# Patient Record
Sex: Female | Born: 1965 | Race: White | Hispanic: No | Marital: Married | Smoking: Never smoker
Health system: Southern US, Community
[De-identification: ages and names within clinical notes are randomized; demographics above are authoritative.]

## PROBLEM LIST (undated history)

## (undated) DIAGNOSIS — F329 Major depressive disorder, single episode, unspecified: Secondary | ICD-10-CM

## (undated) DIAGNOSIS — K219 Gastro-esophageal reflux disease without esophagitis: Secondary | ICD-10-CM

## (undated) DIAGNOSIS — F419 Anxiety disorder, unspecified: Secondary | ICD-10-CM

## (undated) DIAGNOSIS — F32A Depression, unspecified: Secondary | ICD-10-CM

## (undated) HISTORY — PX: TUBAL LIGATION: SHX77

## (undated) HISTORY — PX: CHOLECYSTECTOMY: SHX55

---

## 2008-11-27 ENCOUNTER — Ambulatory Visit (HOSPITAL_BASED_OUTPATIENT_CLINIC_OR_DEPARTMENT_OTHER): Admission: RE | Admit: 2008-11-27 | Discharge: 2008-11-27 | Payer: Self-pay | Admitting: Internal Medicine

## 2008-11-27 ENCOUNTER — Ambulatory Visit: Payer: Self-pay | Admitting: Diagnostic Radiology

## 2014-05-09 ENCOUNTER — Encounter (HOSPITAL_BASED_OUTPATIENT_CLINIC_OR_DEPARTMENT_OTHER): Payer: Self-pay | Admitting: Emergency Medicine

## 2014-05-09 ENCOUNTER — Emergency Department (HOSPITAL_BASED_OUTPATIENT_CLINIC_OR_DEPARTMENT_OTHER)
Admission: EM | Admit: 2014-05-09 | Discharge: 2014-05-09 | Disposition: A | Discharge status: Home / Self Care | Payer: Commercial Managed Care - PPO | Attending: Emergency Medicine | Admitting: Emergency Medicine

## 2014-05-09 DIAGNOSIS — F329 Major depressive disorder, single episode, unspecified: Secondary | ICD-10-CM | POA: Insufficient documentation

## 2014-05-09 DIAGNOSIS — Y9389 Activity, other specified: Secondary | ICD-10-CM | POA: Diagnosis not present

## 2014-05-09 DIAGNOSIS — K219 Gastro-esophageal reflux disease without esophagitis: Secondary | ICD-10-CM | POA: Diagnosis not present

## 2014-05-09 DIAGNOSIS — F419 Anxiety disorder, unspecified: Secondary | ICD-10-CM | POA: Insufficient documentation

## 2014-05-09 DIAGNOSIS — W260XXA Contact with knife, initial encounter: Secondary | ICD-10-CM | POA: Insufficient documentation

## 2014-05-09 DIAGNOSIS — Z79899 Other long term (current) drug therapy: Secondary | ICD-10-CM | POA: Diagnosis not present

## 2014-05-09 DIAGNOSIS — S61211A Laceration without foreign body of left index finger without damage to nail, initial encounter: Secondary | ICD-10-CM | POA: Diagnosis present

## 2014-05-09 DIAGNOSIS — S61311A Laceration without foreign body of left index finger with damage to nail, initial encounter: Secondary | ICD-10-CM | POA: Diagnosis not present

## 2014-05-09 DIAGNOSIS — Y9289 Other specified places as the place of occurrence of the external cause: Secondary | ICD-10-CM | POA: Diagnosis not present

## 2014-05-09 HISTORY — DX: Anxiety disorder, unspecified: F41.9

## 2014-05-09 HISTORY — DX: Major depressive disorder, single episode, unspecified: F32.9

## 2014-05-09 HISTORY — DX: Depression, unspecified: F32.A

## 2014-05-09 HISTORY — DX: Gastro-esophageal reflux disease without esophagitis: K21.9

## 2014-05-09 MED ORDER — LIDOCAINE HCL (PF) 1 % IJ SOLN
5.0000 mL | Freq: Once | INTRAMUSCULAR | Status: DC
  Filled 2014-05-09: qty 5

## 2014-05-09 NOTE — ED Notes (Signed)
I removed patient's bandage, wound is actively bleeding, so I did not start a soak until Dr. / PA. Approve.

## 2014-05-09 NOTE — Discharge Instructions (Signed)
Keep wound clean. Refer to attached documents for more information. Return to the ED in 10 days for suture removal.  °

## 2014-05-09 NOTE — ED Provider Notes (Signed)
CSN: [id number]     Arrival date & time 05/09/14  2157 History   First MD Initiated Contact with Patient 05/09/14 2208     Chief Complaint  Patient presents with  . Extremity Laceration     (Consider location/radiation/quality/duration/timing/severity/associated sxs/prior Treatment) HPI Comments: Patient is a 48 year old female who presents with a laceration of her left index finger that occurred prior to arrival. Patient reports cutting a lime in her hand when the knife slipped, and she cut her left index finger. No other injuries. Patient reports moderate pain at the left index finger without radiation with associated bleeding. No other injuries. Patient denies any other symptoms.    Past Medical History  Diagnosis Date  . Anxiety   . Depression   . GERD (gastroesophageal reflux disease)    Past Surgical History  Procedure Laterality Date  . Cholecystectomy    . Tubal ligation     History reviewed. No pertinent family history. History  Substance Use Topics  . Smoking status: Never Smoker   . Smokeless tobacco: Not on file  . Alcohol Use: Yes     Comment: 2 - 3 times a week   OB History   Grav Para Term Preterm Abortions TAB SAB Ect Mult Living                 Review of Systems  Constitutional: Negative for fever, chills and fatigue.  HENT: Negative for trouble swallowing.   Eyes: Negative for visual disturbance.  Respiratory: Negative for shortness of breath.   Cardiovascular: Negative for chest pain and palpitations.  Gastrointestinal: Negative for nausea, vomiting, abdominal pain and diarrhea.  Genitourinary: Negative for dysuria and difficulty urinating.  Musculoskeletal: Negative for arthralgias and neck pain.  Skin: Positive for wound. Negative for color change.  Neurological: Negative for dizziness and weakness.  Psychiatric/Behavioral: Negative for dysphoric mood.      Allergies  Review of patient's allergies indicates no known allergies.  Home  Medications   Prior to Admission medications   Medication Sig Start Date End Date Taking? Authorizing Provider  ALPRAZolam (XANAX PO) Take by mouth.   Yes Historical Provider, MD  Amphetamine-Dextroamphetamine (ADDERALL PO) Take by mouth.   Yes Historical Provider, MD  BuPROPion HCl (WELLBUTRIN PO) Take by mouth.   Yes Historical Provider, MD  Pantoprazole Sodium (PROTONIX PO) Take by mouth.   Yes Historical Provider, MD   BP 140/109  Pulse 74  Temp(Src) 97.5 F (36.4 C) (Oral)  Resp 18  Ht 5\' 6"  (1.676 m)  Wt 141 lb (63.957 kg)  BMI 22.77 kg/m2  SpO2 99% Physical Exam  Nursing note and vitals reviewed. Constitutional: She appears well-developed and well-nourished. No distress.  HENT:  Head: Normocephalic and atraumatic.  Eyes: Conjunctivae are normal.  Neck: Normal range of motion.  Cardiovascular: Normal rate and regular rhythm.  Exam reveals no gallop and no friction rub.   No murmur heard. Pulmonary/Chest: Effort normal and breath sounds normal. She has no wheezes. She has no rales. She exhibits no tenderness.  Abdominal: Soft. There is no tenderness.  Musculoskeletal: Normal range of motion.  Full ROM of left index finger. Flexor and extensor tendons intact.   Neurological: She is alert.  Speech is goal-oriented. Moves limbs without ataxia.   Skin: Skin is warm and dry.  Deep 2.5 cm laceration of volar aspect of base of left index finger. Bleeding controlled.     ED Course  Procedures (including critical care time) Labs Review Labs Reviewed -  No data to display  LACERATION REPAIR Performed by: Emilia BeckKaitlyn Brinden Kincheloe Authorized by: Emilia BeckKaitlyn Rojelio Uhrich Consent: Verbal consent obtained. Risks and benefits: risks, benefits and alternatives were discussed Consent given by: patient Patient identity confirmed: provided demographic data Prepped and Draped in normal sterile fashion Wound explored  Laceration Location: volar aspect of base of left index finger  Laceration  Length: 2.5 cm  No Foreign Bodies seen or palpated  Anesthesia: local infiltration  Local anesthetic: lidocaine 1% without epinephrine  Anesthetic total: 2 ml  Irrigation method: syringe Amount of cleaning: standard  Skin closure: 5-0 prolene  Number of sutures: 7  Technique: simple  Patient tolerance: Patient tolerated the procedure well with no immediate complications.   Imaging Review No results found.   EKG Interpretation None      MDM   Final diagnoses:  Laceration of left index finger w/o foreign body with damage to nail, initial encounter    11:22 PM Laceration repaired without difficulty. Patient instructed to keep wound clean and covered. Patient will return in 10 days for suture removal. No neurovascular compromise or tendon injury.   Emilia BeckKaitlyn Asiya Cutbirth, PA-C 05/09/14 2329  Rolland PorterMark James, MD 05/15/14 [id number]

## 2014-05-09 NOTE — ED Notes (Signed)
Pt reports laceration to left hand index finger, after a cutting a lime - bleeding controlled at this time - pt reports she thinks she passed out after the laceration.

## 2016-09-09 ENCOUNTER — Encounter (HOSPITAL_BASED_OUTPATIENT_CLINIC_OR_DEPARTMENT_OTHER): Payer: Self-pay | Admitting: Adult Health

## 2016-09-09 ENCOUNTER — Emergency Department (HOSPITAL_BASED_OUTPATIENT_CLINIC_OR_DEPARTMENT_OTHER)
Admission: EM | Admit: 2016-09-09 | Discharge: 2016-09-09 | Disposition: A | Discharge status: Home / Self Care | Payer: Commercial Managed Care - PPO | Attending: Emergency Medicine | Admitting: Emergency Medicine

## 2016-09-09 ENCOUNTER — Emergency Department (HOSPITAL_BASED_OUTPATIENT_CLINIC_OR_DEPARTMENT_OTHER): Payer: Commercial Managed Care - PPO

## 2016-09-09 DIAGNOSIS — S01112A Laceration without foreign body of left eyelid and periocular area, initial encounter: Secondary | ICD-10-CM | POA: Diagnosis not present

## 2016-09-09 DIAGNOSIS — Y999 Unspecified external cause status: Secondary | ICD-10-CM | POA: Insufficient documentation

## 2016-09-09 DIAGNOSIS — W19XXXA Unspecified fall, initial encounter: Secondary | ICD-10-CM

## 2016-09-09 DIAGNOSIS — Z79899 Other long term (current) drug therapy: Secondary | ICD-10-CM | POA: Insufficient documentation

## 2016-09-09 DIAGNOSIS — S0181XA Laceration without foreign body of other part of head, initial encounter: Secondary | ICD-10-CM

## 2016-09-09 DIAGNOSIS — Y9389 Activity, other specified: Secondary | ICD-10-CM | POA: Diagnosis not present

## 2016-09-09 DIAGNOSIS — W1839XA Other fall on same level, initial encounter: Secondary | ICD-10-CM | POA: Diagnosis not present

## 2016-09-09 DIAGNOSIS — M25532 Pain in left wrist: Secondary | ICD-10-CM

## 2016-09-09 DIAGNOSIS — Y929 Unspecified place or not applicable: Secondary | ICD-10-CM | POA: Diagnosis not present

## 2016-09-09 MED ORDER — LIDOCAINE-EPINEPHRINE-TETRACAINE (LET) SOLUTION
3.0000 mL | Freq: Once | NASAL | Status: AC
  Administered 2016-09-09: 3 mL via TOPICAL
  Filled 2016-09-09: qty 3

## 2016-09-09 MED ORDER — LIDOCAINE HCL (PF) 2 % IJ SOLN
INTRAMUSCULAR | Status: AC
  Administered 2016-09-09: 5 mL
  Filled 2016-09-09: qty 2

## 2016-09-09 MED ORDER — LIDOCAINE-EPINEPHRINE (PF) 2 %-1:200000 IJ SOLN: 10.0000 mL | Freq: Once | INTRAMUSCULAR | Status: DC

## 2016-09-09 MED ORDER — TETANUS-DIPHTH-ACELL PERTUSSIS 5-2.5-18.5 LF-MCG/0.5 IM SUSP
0.5000 mL | Freq: Once | INTRAMUSCULAR | Status: AC
  Administered 2016-09-09: 0.5 mL via INTRAMUSCULAR
  Filled 2016-09-09: qty 0.5

## 2016-09-09 MED ORDER — OXYCODONE-ACETAMINOPHEN 5-325 MG PO TABS
1.0000 | ORAL_TABLET | ORAL | Status: DC | PRN
  Administered 2016-09-09: 1 via ORAL
  Filled 2016-09-09: qty 1

## 2016-09-09 NOTE — ED Provider Notes (Signed)
[facility]   CSN: [id number] Arrival date & time: 09/09/16  1539     History   Chief Complaint Chief Complaint  Patient presents with  . Fall    HPI Carolyn Pearson is a 51 y.o. female presenting post mechanical fall today. She states she fell while pulling a heavy wheel barrow and it started to turn over and lose balance, the wheel barrel took her down with it and landed on left wrist, left cheek and forehead onto concrete. She states her left cheek hurts the most and felt like the pain was worsening, constant and 8/10. She states her left cheek pain is worse than her left hand pain. Denies LOC. Denies neck pain. She denies any vision changed, chest pain, shortness of breath, fever, chills, nausea, vomiting, diarrhea. She states she has a history of carpel tunnel to wrist, but no other previous history. She reports 2 small laceration to left eyelid, left cheek swelling and bruising. She reports being able to open mouth fully. She reports coming straight to the ED and did not try anything. She states she was given a percocet with relief. She denies anticoagulation use or any type of blood thinner use. Does not know tenderness status.  The history is provided by the patient. No language interpreter was used.    Past Medical History:  Diagnosis Date  . Anxiety   . Depression   . GERD (gastroesophageal reflux disease)     There are no active problems to display for this patient.   Past Surgical History:  Procedure Laterality Date  . CHOLECYSTECTOMY    . TUBAL LIGATION      OB History    No data available       Home Medications    Prior to Admission medications   Medication Sig Start Date End Date Taking? Authorizing Provider  ALPRAZolam (XANAX PO) Take by mouth.    Historical Provider, MD  Amphetamine-Dextroamphetamine (ADDERALL PO) Take by mouth.    Historical Provider, MD  BuPROPion HCl (WELLBUTRIN PO) Take by mouth.    Historical Provider,  MD  Pantoprazole Sodium (PROTONIX PO) Take by mouth.    Historical Provider, MD    Family History History reviewed. No pertinent family history.  Social History Social History  Substance Use Topics  . Smoking status: Never Smoker  . Smokeless tobacco: Not on file  . Alcohol use Yes     Comment: 2 - 3 times a week     Allergies   Patient has no known allergies.   Review of Systems Review of Systems  Constitutional: Negative for chills and fever.  Eyes: Negative for photophobia and visual disturbance.  Respiratory: Negative for shortness of breath.   Cardiovascular: Negative for chest pain.  Skin: Positive for wound.     Physical Exam Updated Vital Signs BP 127/87 (BP Location: Right Arm)   Pulse 75   Temp 97.9 F (36.6 C) (Oral)   Resp 18   Ht 5\' 6"  (1.676 m)   Wt 68 kg   LMP 08/25/2016 (Exact Date)   SpO2 100%   BMI 24.21 kg/m   Physical Exam  Constitutional: She is oriented to person, place, and time. She appears well-developed and well-nourished.  Well appearing  HENT:  Head: Normocephalic and atraumatic.  Nose: Nose normal.  Mouth/Throat: Oropharynx is clear and moist.  Dentition intact. 2 lacerations visualized of left. 1 is about 0.5cm. 2nd laceration is about 1 cm.   Eyes: Conjunctivae and EOM  are normal.  Neck: Normal range of motion.  Cardiovascular: Normal rate, normal heart sounds and intact distal pulses.   Pulmonary/Chest: Effort normal and breath sounds normal. No respiratory distress. She has no wheezes. She has no rales.  Normal work of breathing. No respiratory distress noted.   Abdominal: Soft. There is no tenderness. There is no rebound and no guarding.  Musculoskeletal: Normal range of motion.  Full ROM of left wrist and fingers. No redness, wound, swelling, deformity noted.  No midline cervical, thoracic, or lumbar spine tenderness. No paraspinal tenderness.   Neurological: She is alert and oriented to person, place, and time.    Sensation intact to distal to affected wrist. Muscle strength 5/5 to left wrist and fingers against resistance to flexion and extension.   Cranial Nerves:  III,IV, VI: ptosis not present, extra-ocular movements intact bilaterally, direct and consensual pupillary light reflexes intact bilaterally V: facial sensation, jaw opening, and bite strength equal bilaterally VII: eyebrow raise, eyelid close, smile, frown, pucker equal bilaterally VIII: hearing grossly normal bilaterally  IX,X: palate elevation and swallowing intact XI: bilateral shoulder shrug and lateral head rotation equal and strong XII: midline tongue extension  Negative pronator drift, negative Romberg, negative RAM's, negative heel-to-shin, negative finger to nose.    Sensory intact.  Muscle strength 5/5 Patient able to ambulate without difficulty.   Skin: Skin is warm.  Psychiatric: She has a normal mood and affect. Her behavior is normal.  Nursing note and vitals reviewed.    ED Treatments / Results  Labs (all labs ordered are listed, but only abnormal results are displayed) Labs Reviewed - No data to display  EKG  EKG Interpretation None       Radiology Dg Wrist Complete Left  Result Date: 09/09/2016 CLINICAL DATA:  Fall under left wrist earlier today. Lateral pain extending to the thumb. Initial encounter. EXAM: LEFT WRIST - COMPLETE 3+ VIEW COMPARISON:  None. FINDINGS: There is no evidence of fracture or dislocation. There is no evidence of arthropathy or other focal bone abnormality. Soft tissues are unremarkable. IMPRESSION: Negative. Electronically Signed   By: Marnee Spring M.D.   On: 09/09/2016 17:09    Procedures .Marland KitchenLaceration Repair Date/Time: 09/09/2016 7:04 PM Performed by: Alvina Chou Authorized by: Alvina Chou   Consent:    Consent obtained:  Verbal   Consent given by:  Patient   Risks discussed:  Infection, need for additional repair, poor cosmetic result, pain and  poor wound healing   Alternatives discussed:  No treatment and delayed treatment Anesthesia (see MAR for exact dosages):    Anesthesia method:  Local infiltration and topical application   Topical anesthetic:  LET   Local anesthetic:  Lidocaine 2% w/o epi Laceration details:    Location:  Face   Face location:  L upper eyelid   Extent:  Superficial   Wound length (cm): 2 lacerations. 1 is 0.5 cm. 2nd 1cm.   Depth (mm):  1 Repair type:    Repair type:  Simple Pre-procedure details:    Preparation:  Patient was prepped and draped in usual sterile fashion Exploration:    Hemostasis achieved with:  LET and direct pressure   Wound exploration: wound explored through full range of motion and entire depth of wound probed and visualized     Wound extent: no areolar tissue violation noted, no fascia violation noted, no foreign bodies/material noted, no muscle damage noted, no nerve damage noted, no tendon damage noted, no underlying fracture noted and  no vascular damage noted     Contaminated: no   Treatment:    Area cleansed with:  Betadine   Amount of cleaning:  Standard   Irrigation solution:  Sterile saline   Irrigation volume:  30 cc   Irrigation method:  Pressure wash and syringe   Visualized foreign bodies/material removed: no   Skin repair:    Repair method:  Sutures   Suture size:  6-0   Suture material:  Prolene   Suture technique:  Simple interrupted   Number of sutures:  7 Approximation:    Approximation:  Close   Vermilion border: well-aligned   Post-procedure details:    Dressing:  Sterile dressing   Patient tolerance of procedure:  Tolerated well, no immediate complications   (including critical care time)  Medications Ordered in ED Medications  lidocaine-EPINEPHrine-tetracaine (LET) solution (3 mLs Topical Given 09/09/16 1611)  lidocaine (XYLOCAINE) 2 % injection (5 mLs  Given 09/09/16 1919)  Tdap (BOOSTRIX) injection 0.5 mL (0.5 mLs Intramuscular Given 09/09/16 1823)       Initial Impression / Assessment and Plan / ED Course  I have reviewed the triage vital signs and the nursing notes.  Pertinent labs & imaging results that were available during my care of the patient were reviewed by me and considered in my medical decision making (see chart for details).    Patient presents today status post mechanical fall. Patient X-Ray negative for obvious fracture or dislocation of left wrist. Patient afebrile, hemodynamically stable, in no apparent distress. Pain managed in ED.  Patient had 2 lacerations over her left periorbital area. 7 stitches placed without any complications. The area did not look red or infectious. Patient was not given any antibiotics to go home with her as I did not feel it was necessary at this time. Tdap injection given here in ED. Pt advised to follow up with PCP regarding today's visit.  Conservative therapy recommended and discussed. Patient given instructions to return after 3-5 days for wound recheck and suture removal. Reasons to immediately return to the emergency department discussed.    Final Clinical Impressions(s) / ED Diagnoses   Final diagnoses:  Fall, initial encounter  Facial laceration, initial encounter  Left wrist pain    New Prescriptions Discharge Medication List as of 09/09/2016  7:10 PM       [address] [place], [place] 09/10/16 0101    Gwyneth Sprout, MD 09/10/16 1719

## 2016-09-09 NOTE — Discharge Instructions (Signed)
Please go to your primary care provider, urgent care or come back to this ED for wound recheck and suture removal in 3-5 days. Keep area clean and dry. Do not soak with water, pat dry. Use sunscreen to the area for the first year.   Get help right away if: You develop severe swelling around the injury site. Your pain suddenly increases and is severe. You develop painful lumps near the wound or on skin that is anywhere on your body. You have a red streak going away from your wound. The wound is on your hand or foot and you cannot properly move a finger or toe. The wound is on your hand or foot and you notice that your fingers or toes look pale or bluish.

## 2016-09-09 NOTE — ED Triage Notes (Signed)
Presents post fall, while pulling a heavy wheel barrow and it started to turn over and lose balance, the wheel barrel took her down with it and pt landed on left wrist, left cheek and forehead. Denies LOC. Denies neck pain  2 small laceration to left eyelid, left cheek swelling and bruising. Able to open mouth fully, EOMI intact. Teeth intact.

## 2016-09-09 NOTE — ED Notes (Signed)
PT discharged to home with patient. NAD.

## 2017-12-06 IMAGING — CR DG WRIST COMPLETE 3+V*L*
4 series · 4 of 4 positions shown · non-contrast
Comparison: None.

CLINICAL DATA: Fall under left wrist earlier today. Lateral pain
extending to the thumb. Initial encounter.

EXAM:
LEFT WRIST - COMPLETE 3+ VIEW

[x wrist pa left]
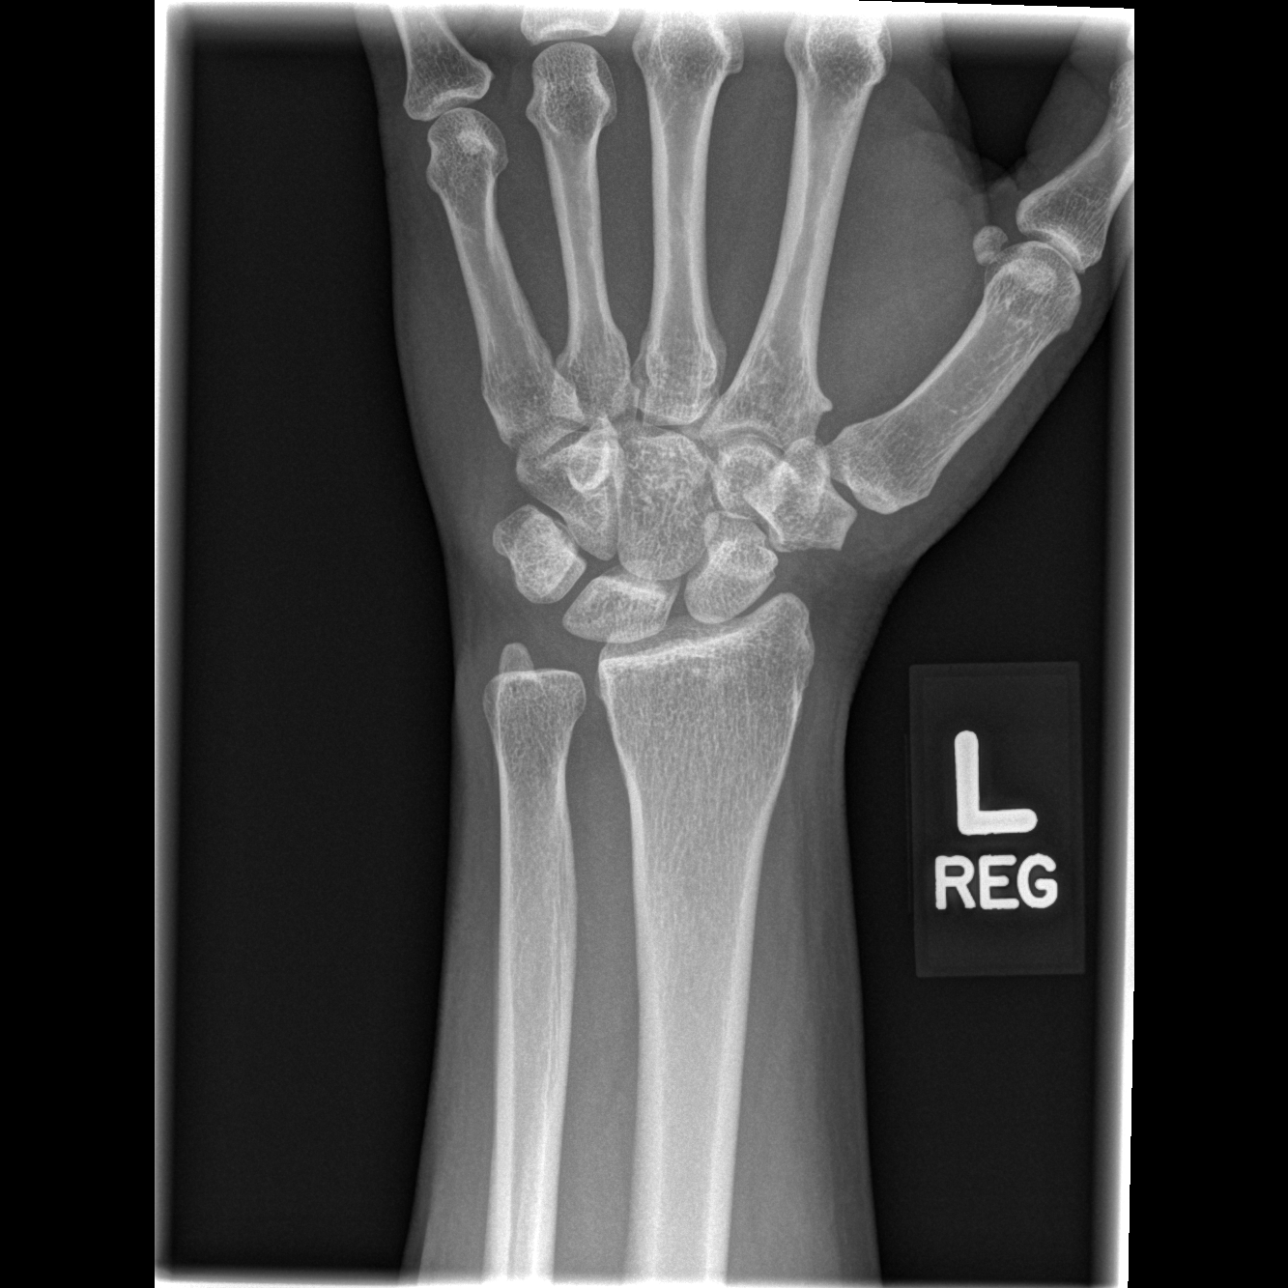

[x wrist obl left]
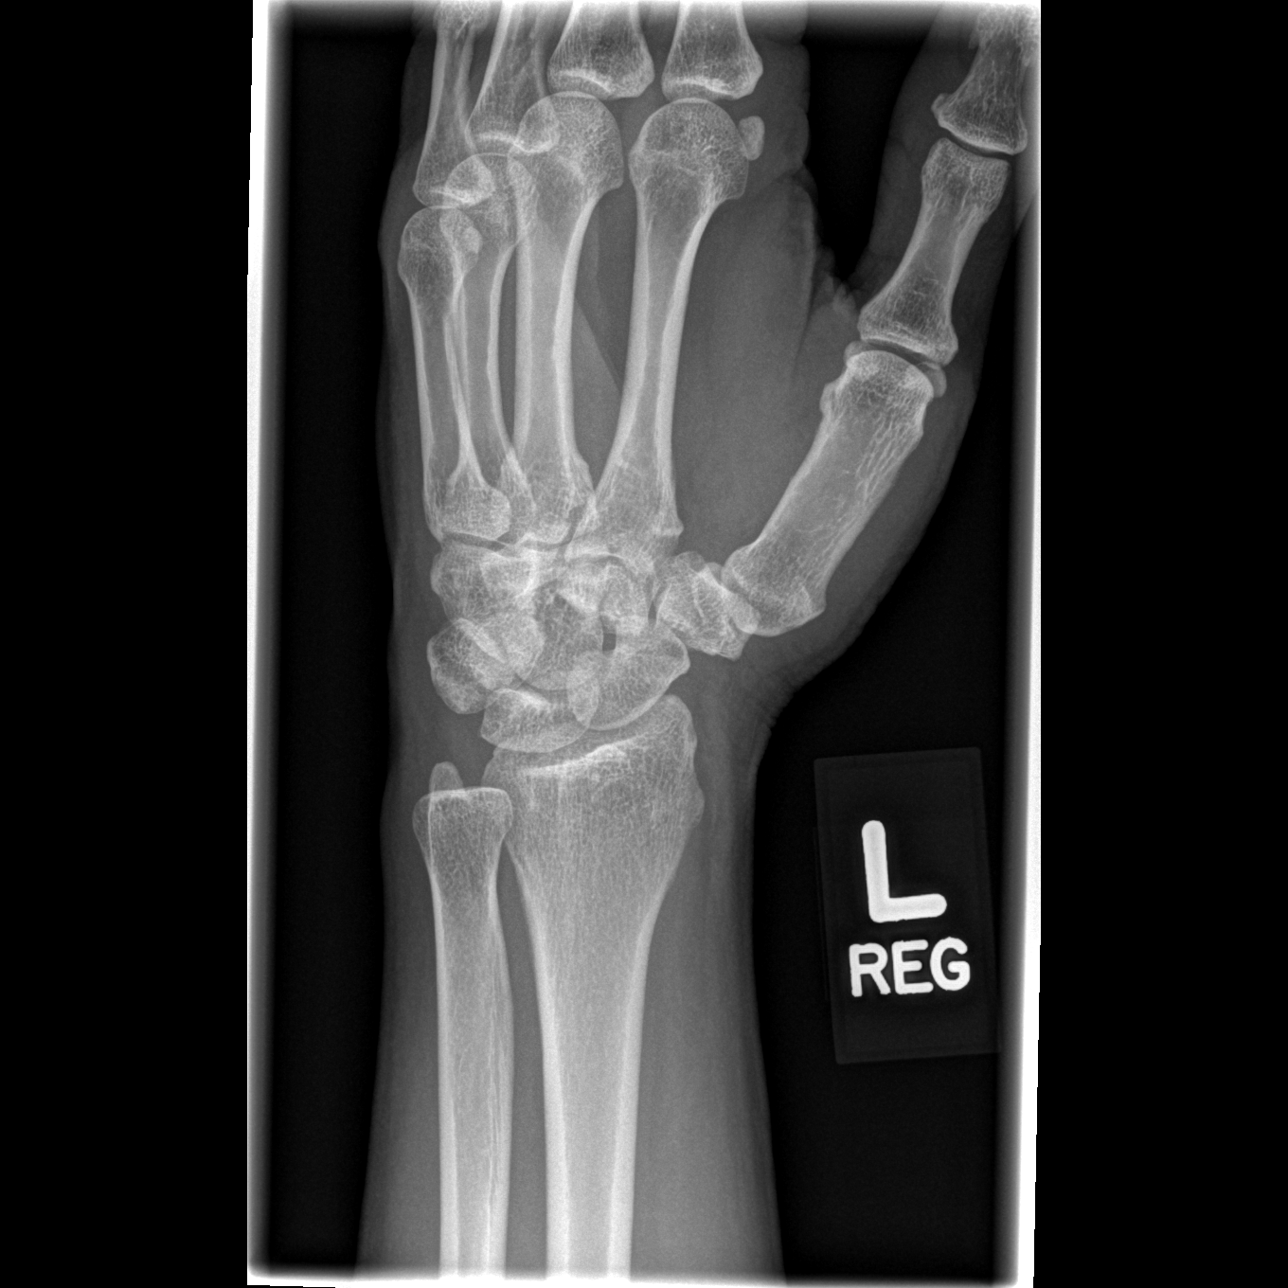

[x wrist lat left]
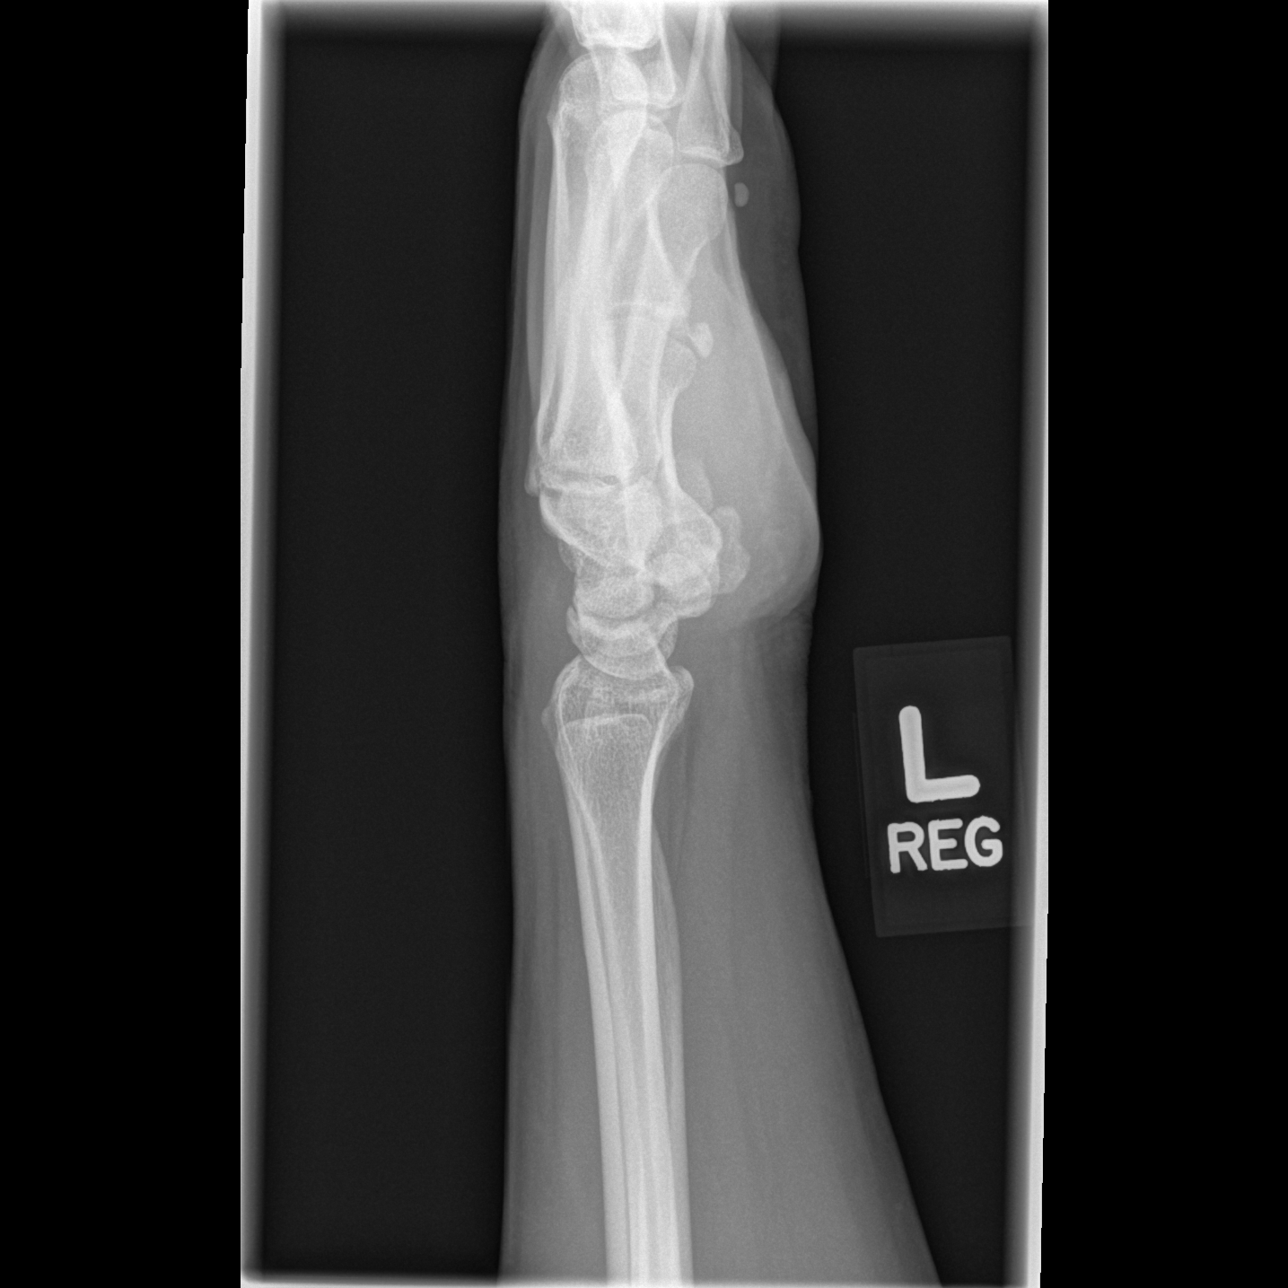

[x navicular]
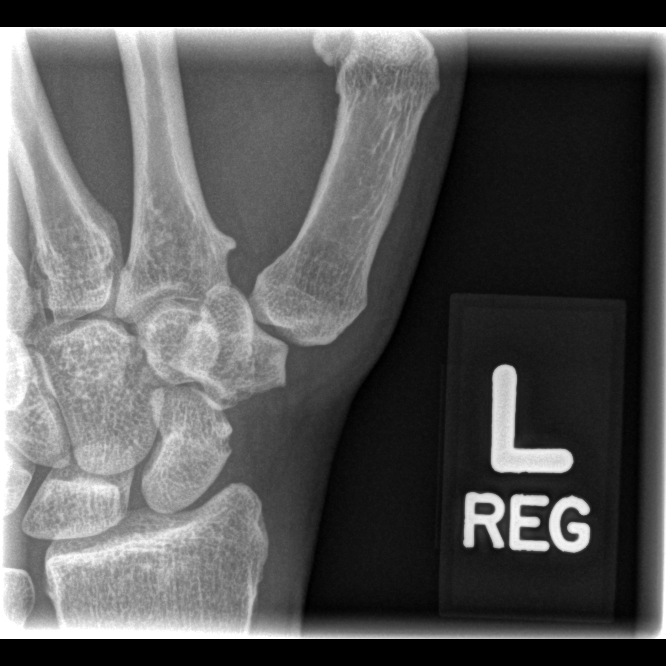

[4 of 4 positions shown; findings below may reference images not displayed]

FINDINGS: There is no evidence of fracture or dislocation. There is no
evidence of arthropathy or other focal bone abnormality. Soft
tissues are unremarkable.
IMPRESSION: Negative.

## 2018-09-10 ENCOUNTER — Ambulatory Visit (INDEPENDENT_AMBULATORY_CARE_PROVIDER_SITE_OTHER): Payer: BLUE CROSS/BLUE SHIELD | Admitting: Psychiatry

## 2018-09-10 ENCOUNTER — Encounter: Payer: Self-pay | Admitting: Psychiatry

## 2018-09-10 DIAGNOSIS — F411 Generalized anxiety disorder: Secondary | ICD-10-CM
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
## 2018-10-02 ENCOUNTER — Other Ambulatory Visit: Payer: Self-pay

## 2018-10-02 ENCOUNTER — Encounter: Payer: Self-pay | Admitting: Psychiatry

## 2018-10-02 ENCOUNTER — Ambulatory Visit (INDEPENDENT_AMBULATORY_CARE_PROVIDER_SITE_OTHER): Payer: BLUE CROSS/BLUE SHIELD | Admitting: Psychiatry

## 2018-10-02 DIAGNOSIS — F411 Generalized anxiety disorder: Secondary | ICD-10-CM

## 2018-10-02 NOTE — Progress Notes (Signed)
[facility] Counselor/Therapist Progress Note  Patient ID: Carolyn Pearson, MRN: [id number],    Date: 10/02/2018  Time Spent: 48 minutes Start time 10:56AM end time 11:44 AM  Treatment Type: Individual Therapy-telephone visit by patient choice  Virtual Visit via Telephone Note I connected with patient by a video enabled telemedicine application or telephone, with their informed consent, and verified patient privacy and that I am speaking with the correct person using two identifiers.    I discussed the limitations, risks, security and privacy concerns of performing psychotherapy and management service by telephone and the availability of in person appointments. I also discussed with the patient that there may be a patient responsible charge related to this service. The patient expressed understanding and agreed to proceed.  I discussed the treatment planning with the patient. The patient was provided an opportunity to ask questions and all were answered. The patient agreed with the plan and demonstrated an understanding of the instructions.   The patient was advised to call  our office if  symptoms worsen or feel they are in a crisis state and need immediate contact.    Reported Symptoms: anxiety, sleep issues, irritability, mood swings  Mental Status Exam:  Appearance:   NA     Behavior:  Sharing  Motor:  na  Speech/Language:   Normal Rate  Affect:  Appropriate  Mood:  normal  Thought process:  normal  Thought content:    Rumination  Sensory/Perceptual disturbances:    WNL  Orientation:  oriented to person, place, time/date and situation  Attention:  Good  Concentration:  Good  Memory:  Immediate;   Fair  Fund of knowledge:   Good  Insight:    Good  Judgment:   Good  Impulse Control:  Good   Risk Assessment: Danger to Self:  No Self-injurious Behavior: No Danger to Others: No Duty to Warn:no Physical Aggression / Violence:No  Access to Firearms a concern: No   Gang Involvement:No   Subjective: Patient reported she feels less than and feels she does not deserve good things.She reported that wants to figure out why she feels that way and what she can do about it.  Developed treatment plan in session with patient to address those issues surrounding her anxiety and her automatic negative thoughts.  Treatment plan could not be signed due to it being a phone visit.  Patient reported she did not want to do a visual visit because she finds them overwhelming and anxiety producing.  Agreed that during this difficult time phone visits would be utilized until she can return to the office.  Discussed situation and how it has impacted her anxiety.  Patient was able to think through different options she may have to help manage things differently at home.  Patient was taught CBT cycle that thoughts lead to feelings lead to behaviors.  The importance of working on changing her automatic negative thoughts were discussed with patient.  Patient is to start by reminding herself daily that she does deserve good things.  Also talked patient the grounded in 5 exercise.  She agreed to practice it over the next few weeks.  Patient is to try and think of things she is thankful for while she is trying to fall asleep to see if that helps her fall asleep faster.  Discussed E MDR and how that could be utilized as a treatment option with patient.  She reported being interested and hoping to start the processing soon.  Interventions:  Cognitive Behavioral Therapy and Solution-Oriented/Positive Psychology  Diagnosis:   ICD-10-CM   1. Generalized anxiety disorder F41.1     Plan: 1.  Patient to continue to engage in individual counseling 2-4 times a month or as needed. 2.  Patient to identify and apply CBT, coping skills learned in session to decrease anxiety symptoms. 3.  Patient to contact this office, go to the local ED or call 911 if a crisis or emergency develops between visits.  Stevphen Meuse, [place]    This record has been created using [employer].  Chart creation errors have been sought, but may not always have been located and corrected. Such creation errors do not reflect on the standard of medical care.

## 2018-10-16 ENCOUNTER — Ambulatory Visit: Payer: BLUE CROSS/BLUE SHIELD | Admitting: Psychiatry

## 2018-10-30 ENCOUNTER — Ambulatory Visit: Payer: BLUE CROSS/BLUE SHIELD | Admitting: Psychiatry

## 2018-11-13 ENCOUNTER — Ambulatory Visit: Payer: BLUE CROSS/BLUE SHIELD | Admitting: Psychiatry

## 2018-11-27 ENCOUNTER — Ambulatory Visit: Payer: BLUE CROSS/BLUE SHIELD | Admitting: Psychiatry

## 2018-12-11 ENCOUNTER — Ambulatory Visit (INDEPENDENT_AMBULATORY_CARE_PROVIDER_SITE_OTHER): Payer: BC Managed Care – PPO | Admitting: Psychiatry

## 2018-12-11 ENCOUNTER — Other Ambulatory Visit: Payer: Self-pay

## 2018-12-11 DIAGNOSIS — F411 Generalized anxiety disorder: Secondary | ICD-10-CM
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
## 2018-12-13 ENCOUNTER — Encounter: Payer: Self-pay | Admitting: Psychiatry

## 2018-12-19 ENCOUNTER — Other Ambulatory Visit: Payer: Self-pay

## 2018-12-19 ENCOUNTER — Ambulatory Visit: Payer: BC Managed Care – PPO | Admitting: Psychiatry

## 2018-12-19 ENCOUNTER — Encounter: Payer: Self-pay | Admitting: Psychiatry

## 2018-12-19 DIAGNOSIS — F411 Generalized anxiety disorder: Secondary | ICD-10-CM

## 2018-12-19 NOTE — Progress Notes (Signed)
      [facility] Counselor/Therapist Progress Note  Patient ID: Carolyn Pearson, MRN: [id number],    Date: 12/19/2018  Time Spent: 52 minutes start 9:03 AM end time 9:55 AM  Treatment Type: Individual Therapy  Reported Symptoms: anxiety, overwhelmed, crying spells  Mental Status Exam:  Appearance:   Casual     Behavior:  Appropriate  Motor:  Restlestness  Speech/Language:   Normal Rate  Affect:  Congruent  Mood:  anxious  Thought process:  normal  Thought content:    WNL  Sensory/Perceptual disturbances:    WNL  Orientation:  oriented to person, place, time/date and situation  Attention:  Good  Concentration:  Good  Memory:  WNL  Fund of knowledge:   Good  Insight:    Good  Judgment:   Good  Impulse Control:  Good   Risk Assessment: Danger to Self:  No Self-injurious Behavior: No Danger to Others: No Duty to Warn:no Physical Aggression / Violence:No  Access to Firearms a concern: No  Gang Involvement:No   Subjective: Patient was present for session. She shared that she had enjoyed her son's graduation and things were going okay until a text from her brother.  She explained that after that she was full of anxiety and tearful.  Patient shared what happened and did EMDR set on the text, SUDS level was a 10, negative cognition was "I let him down", felt anxiety and sadness all over.  Patient was able to reduce SUDS level to 3.  She realized through the exercise that her brother is all about him and he had not even congratulated her son for graduating.  Patient was able to realize that she does not need her brother's approval continue working on letting go of his comments.  Patient also reported that she and her sister are the ones that care for her mother.  She reported she has been very disappointed because her son has a large family but her 3 eldest siblings do not show any interest in him.  Patient was encouraged to focus on the fact that her sister and her mother are  committed and she needs to put energy into those relationships.  She agreed to focus on that and remind herself of that over the next week.  She also agreed to work on exercising and taking care of herself.  Interventions: Solution-Oriented/Positive Psychology and Eye Movement Desensitization and Reprocessing (EMDR)  Diagnosis:   ICD-10-CM   1. Generalized anxiety disorder  F41.1     Plan: 1.  Patient to continue to engage in individual counseling 2-4 times a month or as needed. 2.  Patient to identify and apply CBT, coping skills learned in session to decrease anxiety symptoms. 3.  Patient to contact this office, go to the local ED or call 911 if a crisis or emergency develops between visits.  Lina Sayre, [facility]     This record has been created using [employer].  Chart creation errors have been sought, but may not always have been located and corrected. Such creation errors do not reflect on the standard of medical care.

## 2018-12-25 ENCOUNTER — Ambulatory Visit: Payer: BLUE CROSS/BLUE SHIELD | Admitting: Psychiatry

## 2019-01-02 ENCOUNTER — Encounter: Payer: Self-pay | Admitting: Psychiatry

## 2019-01-02 ENCOUNTER — Ambulatory Visit (INDEPENDENT_AMBULATORY_CARE_PROVIDER_SITE_OTHER): Payer: BC Managed Care – PPO | Admitting: Psychiatry

## 2019-01-02 ENCOUNTER — Other Ambulatory Visit: Payer: Self-pay

## 2019-01-02 DIAGNOSIS — F411 Generalized anxiety disorder: Secondary | ICD-10-CM | POA: Diagnosis not present

## 2019-01-02 NOTE — Progress Notes (Signed)
      [facility] Counselor/Therapist Progress Note  Patient ID: Carolyn Pearson, MRN: [id number],    Date: 01/02/2019  Time Spent: 52 minutes start time 9:02 AM end time 9:54 AM  Treatment Type: Individual Therapy   Reported Symptoms: anxiety, rumination, flashbacks   Mental Status Exam:  Appearance:   Casual     Behavior:  Sharing  Motor:  Normal  Speech/Language:   Normal Rate  Affect:  Congruent  Mood:  anxious  Thought process:  normal  Thought content:    Rumination  Sensory/Perceptual disturbances:    WNL  Orientation:  oriented to person, place, time/date and situation  Attention:  Good  Concentration:  Good  Memory:  WNL  Fund of knowledge:   Good  Insight:    Fair  Judgment:   Good  Impulse Control:  Good   Risk Assessment: Danger to Self:  No Self-injurious Behavior: No Danger to Others: No Duty to Warn:no Physical Aggression / Violence:No  Access to Firearms a concern: No  Gang Involvement:No   Subjective: Patient was present for session.  Patient reported she is continuing to have lots of anxiety and still having difficulty letting go of her brother's comments.  She went on to explain that she is having different memories surface from the past as well that are disturbing.  Patient did E MDR set on her brothers comment suds level 10, negative cognition "I cannot do anything right", felt guilty and sadness in her hands.  Patient was able to reduce the level of disturbance to 5.  Patient was encouraged to work on her affirmations and to realize she does not have to please her brother because he has many issues in and of himself.  Patient was able to recognize through the session that each of her siblings seem to struggle as well as her mother.  She shared that when her brother died her mother shut down and nobody talked about it.  She shared they still do not talk about the fact that he passed away.  She also recalled that seem to be the time that things changed and  she felt like she no longer had a mother to give her advice and structure.  Patient was encouraged to do some journaling as memory surface over the next couple weeks.  Interventions: Solution-Oriented/Positive Psychology and Eye Movement Desensitization and Reprocessing (EMDR)  Diagnosis:   ICD-10-CM   1. Generalized anxiety disorder  F41.1     Plan: 1.  Patient to continue to engage in individual counseling 2-4 times a month or as needed. 2.  Patient to identify and apply CBT, coping skills learned in session to decrease  anxiety symptoms. 3.  Patient to contact this office, go to the local ED or call 911 if a crisis or emergency develops between visits.  Lina Sayre, [facility]   This record has been created using [employer].  Chart creation errors have been sought, but may not always have been located and corrected. Such creation errors do not reflect on the standard of medical care.

## 2019-01-08 ENCOUNTER — Ambulatory Visit: Payer: BLUE CROSS/BLUE SHIELD | Admitting: Psychiatry

## 2019-01-09 ENCOUNTER — Ambulatory Visit: Payer: BC Managed Care – PPO | Admitting: Psychiatry

## 2019-01-16 ENCOUNTER — Ambulatory Visit (INDEPENDENT_AMBULATORY_CARE_PROVIDER_SITE_OTHER): Payer: BC Managed Care – PPO | Admitting: Psychiatry

## 2019-01-16 ENCOUNTER — Other Ambulatory Visit: Payer: Self-pay

## 2019-01-16 ENCOUNTER — Encounter: Payer: Self-pay | Admitting: Psychiatry

## 2019-01-16 DIAGNOSIS — F411 Generalized anxiety disorder: Secondary | ICD-10-CM

## 2019-01-16 NOTE — Progress Notes (Signed)
      [facility] Counselor/Therapist Progress Note  Patient ID: Carolyn Pearson, MRN: [id number],    Date: 01/16/2019  Time Spent: 52 minutes  Treatment Type: Individual Therapy  Reported Symptoms: anxiety, social anxiety, sleep issues, focusing issues  Mental Status Exam:  Appearance:   Well Groomed     Behavior:  Appropriate  Motor:  Restlestness  Speech/Language:   Normal Rate  Affect:  Congruent  Mood:  anxious  Thought process:  normal  Thought content:    Rumination  Sensory/Perceptual disturbances:    WNL  Orientation:  oriented to person, place, time/date and situation  Attention:  Good  Concentration:  Good  Memory:  WNL  Fund of knowledge:   Good  Insight:    Good  Judgment:   Good  Impulse Control:  Good   Risk Assessment: Danger to Self:  No Self-injurious Behavior: No Danger to Others: No Duty to Warn:no Physical Aggression / Violence:No  Access to Firearms a concern: No  Gang Involvement:No   Subjective: Patient was present for session.  She reported that it is her brother's birthday and she was having difficulty trying to figure out how she wanted to handle it.  Patient shared she still is having a lot of anxiety when it comes to her brother who was not replied from her text.  Decided to do an E MDR set on that issue.  Patient reported the suds level was 10, negative cognition "I do not matter" she felt sadness and anxiety all over.  Patient was able to reduce suds level to a 6.  She reported she is starting to see some patterns with her brother that were very disturbing and more self-centered than anything else.  Patient was encouraged to use CBT filters when dealing with situations with him to remind herself that she is enough and that she is not the issue in the situation.  Different ways to deal with her mother concerning this issue were addressed as well.  Patient was able to recognize that her mother and father really were not present for her after the age  53 when they moved back to [place] when her dad retired from the service.  Patient is still not sure why that time.  Was so disturbing and seem to alter what occurred in their family but will continue to work on it at next session.  Interventions: Cognitive Behavioral Therapy and Eye Movement Desensitization and Reprocessing (EMDR)  Diagnosis:   ICD-10-CM   1. Generalized anxiety disorder  F41.1     Plan: 1.  Patient to continue to engage in individual counseling 2-4 times a month or as needed. 2.  Patient to identify and apply CBT, coping skills learned in session to decrease  anxiety symptoms. 3.  Patient to contact this office, go to the local ED or call 911 if a crisis or emergency develops between visits.  Lina Sayre, [facility]  This record has been created using [employer].  Chart creation errors have been sought, but may not always have been located and corrected. Such creation errors do not reflect on the standard of medical care.

## 2019-01-23 ENCOUNTER — Other Ambulatory Visit: Payer: Self-pay

## 2019-01-23 ENCOUNTER — Ambulatory Visit (INDEPENDENT_AMBULATORY_CARE_PROVIDER_SITE_OTHER): Payer: BC Managed Care – PPO | Admitting: Psychiatry

## 2019-01-23 ENCOUNTER — Encounter: Payer: Self-pay | Admitting: Psychiatry

## 2019-01-23 DIAGNOSIS — F411 Generalized anxiety disorder: Secondary | ICD-10-CM | POA: Diagnosis not present
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
## 2019-01-30 ENCOUNTER — Other Ambulatory Visit: Payer: Self-pay

## 2019-01-30 ENCOUNTER — Ambulatory Visit (INDEPENDENT_AMBULATORY_CARE_PROVIDER_SITE_OTHER): Payer: BC Managed Care – PPO | Admitting: Psychiatry

## 2019-01-30 ENCOUNTER — Encounter: Payer: Self-pay | Admitting: Psychiatry

## 2019-01-30 DIAGNOSIS — F411 Generalized anxiety disorder: Secondary | ICD-10-CM

## 2019-01-30 NOTE — Progress Notes (Signed)
[facility] Counselor/Therapist Progress Note  Patient ID: Carolyn Pearson, MRN: [id number],    Date: 01/30/2019  Time Spent: 52 minutes start time 9:03 a.m. end time 9:55 AM  Treatment Type: Individual Therapy  Reported Symptoms: anxiety, triggered responses, sleep issues  Mental Status Exam:  Appearance:   Well Groomed     Behavior:  Sharing  Motor:  Normal  Speech/Language:   Normal Rate  Affect:  Appropriate  Mood:  anxious  Thought process:  normal  Thought content:    Rumination  Sensory/Perceptual disturbances:    WNL  Orientation:  oriented to person, place, time/date and situation  Attention:  Good  Concentration:  Good  Memory:  WNL  Fund of knowledge:   Good  Insight:    Good  Judgment:   Good  Impulse Control:  Good   Risk Assessment: Danger to Self:  No Self-injurious Behavior: No Danger to Others: No Duty to Warn:no Physical Aggression / Violence:No  Access to Firearms a concern: No  Gang Involvement:No   Subjective: Patient was present for session.  She reported that her brother had finally responded and sent her son a card that had something very kind and acknowledged that he had been inappropriate.  Patient reported she was very surprised by his response.  Encouraged her to recognize the fact that she had confronted his poor behavior and that is why he was able to recognize the fact that he needed to make it up to her son.  Patient was able to acknowledge typically she never would do something like that and it was a positive thing to have occurred.  Patient went on to explain that her younger brother had gone to see her mother and her mother did not want to talk with her on the phone and made him a wonderful dinner after she had been upset with her for all the work that she had done on the house and not spend enough time with her.  Patient shared she was very hurt by what happened but still did not want to communicate with her mother about her emotions.   Patient was encouraged to recognize the fact that when she communicated with her brother it turned out very positively and by not communicating with her mother she is keeping distant from their relationship and not allowing her to be as close to her she could be.  Different ways to confront that would not be ugly but it would still be clear were discussed with patient.  Patient was encouraged to start journaling her emotions and to consider talking to her mother about the way she feels rather than continuing to hold things inside and having lots of anxiety.  Interventions: Cognitive Behavioral Therapy and Solution-Oriented/Positive Psychology  Diagnosis:   ICD-10-CM   1. Generalized anxiety disorder  F41.1     Plan: 1.  Patient to continue to engage in individual counseling 2-4 times a month or as needed. 2.  Patient to identify and apply CBT, coping skills learned in session to decrease  anxiety symptoms. 3.  Patient to contact this office, go to the local ED or call 911 if a crisis or emergency develops between visits.  Lina Sayre, [facility]   This record has been created using [employer].  Chart creation errors have been sought, but may not always have been located and corrected. Such creation errors do not reflect on the standard of medical care.

## 2019-02-13 ENCOUNTER — Ambulatory Visit: Payer: BC Managed Care – PPO | Admitting: Psychiatry

## 2019-02-20 ENCOUNTER — Ambulatory Visit (INDEPENDENT_AMBULATORY_CARE_PROVIDER_SITE_OTHER): Payer: BC Managed Care – PPO | Admitting: Psychiatry

## 2019-02-20 ENCOUNTER — Other Ambulatory Visit: Payer: Self-pay

## 2019-02-20 ENCOUNTER — Encounter: Payer: Self-pay | Admitting: Psychiatry

## 2019-02-20 DIAGNOSIS — F411 Generalized anxiety disorder: Secondary | ICD-10-CM
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
## 2019-03-07 ENCOUNTER — Other Ambulatory Visit: Payer: Self-pay

## 2019-03-07 ENCOUNTER — Encounter: Payer: Self-pay | Admitting: Psychiatry

## 2019-03-07 ENCOUNTER — Ambulatory Visit: Payer: BC Managed Care – PPO | Admitting: Psychiatry

## 2019-03-07 ENCOUNTER — Ambulatory Visit (INDEPENDENT_AMBULATORY_CARE_PROVIDER_SITE_OTHER): Payer: BC Managed Care – PPO | Admitting: Psychiatry

## 2019-03-07 DIAGNOSIS — F411 Generalized anxiety disorder: Secondary | ICD-10-CM | POA: Diagnosis not present
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
## 2019-03-27 ENCOUNTER — Ambulatory Visit: Payer: BC Managed Care – PPO | Admitting: Psychiatry

## 2019-04-10 ENCOUNTER — Ambulatory Visit (INDEPENDENT_AMBULATORY_CARE_PROVIDER_SITE_OTHER): Payer: BC Managed Care – PPO | Admitting: Psychiatry

## 2019-04-10 ENCOUNTER — Other Ambulatory Visit: Payer: Self-pay

## 2019-04-10 ENCOUNTER — Encounter: Payer: Self-pay | Admitting: Psychiatry

## 2019-04-10 DIAGNOSIS — F411 Generalized anxiety disorder: Secondary | ICD-10-CM
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
## 2019-04-24 ENCOUNTER — Ambulatory Visit: Payer: BC Managed Care – PPO | Admitting: Psychiatry

## 2019-05-01 ENCOUNTER — Ambulatory Visit (INDEPENDENT_AMBULATORY_CARE_PROVIDER_SITE_OTHER): Payer: BC Managed Care – PPO | Admitting: Psychiatry

## 2019-05-01 ENCOUNTER — Other Ambulatory Visit: Payer: Self-pay

## 2019-05-01 DIAGNOSIS — F411 Generalized anxiety disorder: Secondary | ICD-10-CM | POA: Diagnosis not present

## 2019-05-01 NOTE — Progress Notes (Signed)
[facility] Counselor/Therapist Progress Note  Patient ID: Carolyn Pearson, MRN: [id number],    Date: 05/01/2019  Time Spent: 49 minutes start time 3:11 PM end time 4 PM  Treatment Type: Individual Therapy  Reported Symptoms: anxiety, sad, muscle tension, panic  Mental Status Exam:  Appearance:   Casual     Behavior:  Appropriate  Motor:  Normal  Speech/Language:   Normal Rate  Affect:  Appropriate  Mood:  anxious  Thought process:  normal  Thought content:    WNL  Sensory/Perceptual disturbances:    WNL  Orientation:  oriented to person, place, time/date and situation  Attention:  Good  Concentration:  Good  Memory:  WNL  Fund of knowledge:   Good  Insight:    Good  Judgment:   Good  Impulse Control:  Good   Risk Assessment: Danger to Self:  No Self-injurious Behavior: No Danger to Others: No Duty to Warn:no Physical Aggression / Violence:No  Access to Firearms a concern: No  Gang Involvement:No   Subjective: Patient was present for session.  Patient reported she was able to finally have a conversation with her mother about the past.  She was not able to get information concerning what ever happened when she was age 53 and the anxiety seem to have started at that time.  Patient was able to allow her mom some opportunities to talk about what it was like when she had 3 kids in the hospital at the same time and her dad was in [place].  Patient reported that after the conversation her mother has seemed to be happier which is helped patient feel a decrease in anxiety.  Patient was encouraged to recognize that even though she is having a lot of anxiety about talking to her mother about the question she has it seems that her mother responds very positively and she needs to remind herself of that when she considers talking to her more about the past.  She acknowledges that she still wants to understand why her anxiety started so much at that age so she is going to continue trying  to get her mother to share more about the past.  Patient also shared that she is continuing to utilize coping skills from session and that is helping her manage some of her anxiety when it comes to her son and she is noticing a big change in her anxiety when it comes to him.  Patient was encouraged to continue utilizing those coping skills and to remind herself of the facts.  Patient also shared she finally knows that she will have a job and she actually got a promotion which is very positive but the amount of work she has is overwhelming and that is another reason it is hard for her to manage her anxiety completely.  Patient stated she did not feel comfortable with her new primary care provider and did not want to return to him for medication.  Encouraged her to schedule appointment with one of the providers at the practice to monitor her's medication.  Patient was also taught thought stopping technique ST OPP to utilize when her thoughts seem to be cycling and her anxiety seems to be increasing.  Is also to continue exercising and trying to remind herself that she is enough.  Interventions: Cognitive Behavioral Therapy and Solution-Oriented/Positive Psychology  Diagnosis:   ICD-10-CM   1. Generalized anxiety disorder  F41.1     Plan: Patient is to utilize CBT and coping  skills to manage her anxiety appropriately.  She is to continue trying to talk to her mother about the past so she can figure out what is behind her anxiety starting at age 53.  Patient is also to continue exercising and working on her positive affirmations regularly to decrease anxiety as well. Long-term goal: Resolve the core conflict that is the source of anxiety Short-term goal: Identify the major life complex from the past and present that form the basis of the present anxiety Increase understanding of beliefs and messages that produce the worry and anxiety  Stevphen Meuse, [facility]

## 2019-05-02 ENCOUNTER — Encounter: Payer: Self-pay | Admitting: Psychiatry

## 2019-05-02 ENCOUNTER — Ambulatory Visit: Payer: BC Managed Care – PPO | Admitting: Psychiatry

## 2019-05-08 ENCOUNTER — Ambulatory Visit: Payer: BC Managed Care – PPO | Admitting: Psychiatry

## 2019-06-10 ENCOUNTER — Ambulatory Visit: Payer: BC Managed Care – PPO | Admitting: Psychiatry

## 2019-06-24 ENCOUNTER — Ambulatory Visit: Payer: BC Managed Care – PPO | Admitting: Psychiatry
